# Patient Record
Sex: Female | Born: 2000 | Race: Black or African American | Hispanic: No | Marital: Single | Smoking: Never smoker
Health system: Southern US, Community
[De-identification: ages and names within clinical notes are randomized; demographics above are authoritative.]

## PROBLEM LIST (undated history)

## (undated) DIAGNOSIS — J45909 Unspecified asthma, uncomplicated: Secondary | ICD-10-CM

## (undated) DIAGNOSIS — E119 Type 2 diabetes mellitus without complications: Secondary | ICD-10-CM

---
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
## 2019-06-15 ENCOUNTER — Emergency Department
Admission: EM | Admit: 2019-06-15 | Discharge: 2019-06-15 | Disposition: A | Discharge status: Home / Self Care | Payer: Medicaid Other | Attending: Emergency Medicine | Admitting: Emergency Medicine

## 2019-06-15 ENCOUNTER — Encounter: Payer: Self-pay | Admitting: Emergency Medicine

## 2019-06-15 ENCOUNTER — Other Ambulatory Visit: Payer: Self-pay

## 2019-06-15 DIAGNOSIS — Z3A01 Less than 8 weeks gestation of pregnancy: Secondary | ICD-10-CM | POA: Insufficient documentation

## 2019-06-15 DIAGNOSIS — J45909 Unspecified asthma, uncomplicated: Secondary | ICD-10-CM | POA: Diagnosis not present

## 2019-06-15 DIAGNOSIS — O99511 Diseases of the respiratory system complicating pregnancy, first trimester: Secondary | ICD-10-CM | POA: Insufficient documentation

## 2019-06-15 DIAGNOSIS — O9989 Other specified diseases and conditions complicating pregnancy, childbirth and the puerperium: Secondary | ICD-10-CM | POA: Diagnosis present

## 2019-06-15 LAB — HCG, QUANTITATIVE, PREGNANCY: hCG, Beta Chain, Quant, S: 24393 m[IU]/mL — ABNORMAL HIGH (ref <5)

## 2019-06-15 LAB — POCT PREGNANCY, URINE: Preg Test, Ur: POSITIVE — AB

## 2019-06-15 NOTE — ED Notes (Signed)
Pt left prior to receiving d/c instructions.

## 2019-06-15 NOTE — ED Triage Notes (Signed)
Pt states she had a positive pregnancy test today and "I want to know how far along I am". Pt states she missed her period in august but did [place] on July 5th.

## 2019-06-16 NOTE — ED Provider Notes (Signed)
[facility] Emergency Department Provider Note  ____________________________________________  Time seen: Approximately 12:04 AM  I have reviewed the triage vital signs and the nursing notes.   HISTORY  Chief Complaint Possible Pregnancy    HPI Sheena Cordova is a 18 y.o. female presents to the emergency department for confirmation of pregnancy.  Patient took a pregnancy test today and it was positive.  She estimates that her last menstrual cycle was approximately 4 to 5 weeks ago.  She denies abdominal pain or pelvic pain no changes in vaginal discharge or gush of vaginal fluids.  Patient has been afebrile at home.  She states that the pregnancy is unwanted and wants to know what her options are.        Past Medical History:  Diagnosis Date  . Asthma     There are no active problems to display for this patient.   No past surgical history on file.  Prior to Admission medications   Medication Sig Start Date End Date Taking? Authorizing Provider  albuterol (PROVENTIL HFA;VENTOLIN HFA) 108 (90 BASE) MCG/ACT inhaler Inhale 2 puffs into the lungs every 6 (six) hours as needed for wheezing or shortness of breath.    [provider]  amoxicillin-clavulanate (AUGMENTIN) 875-125 MG per tablet Take 1 tablet by mouth 2 (two) times daily. 05/02/15   Triplett, Rulon Eisenmengerari B, FNP  aspirin 325 MG EC tablet Take 325 mg by mouth daily.    [provider]  doxycycline (VIBRAMYCIN) 100 MG capsule Take 1 capsule (100 mg total) by mouth 2 (two) times daily. One po bid x 7 days 09/25/15   Mesner, Barbara CowerJason, MD  HYDROcodone-acetaminophen (NORCO) 5-325 MG tablet Take 1 tablet by mouth every 12 (twelve) hours as needed for severe pain. 09/25/15   Mesner, Barbara CowerJason, MD  ibuprofen (ADVIL,MOTRIN) 400 MG tablet Take 1 tablet (400 mg total) by mouth every 6 (six) hours as needed. 05/05/15   Joni ReiningSmith, Ronald K, PA-C  ibuprofen (ADVIL,MOTRIN) 600 MG tablet Take 1 tablet (600 mg total) by  mouth once. 05/05/15   Joni ReiningSmith, Ronald K, PA-C  ibuprofen (ADVIL,MOTRIN) 800 MG tablet Take 1 tablet (800 mg total) by mouth every 8 (eight) hours as needed. 05/02/15   Triplett, Kasandra Knudsenari B, FNP  phenazopyridine (PYRIDIUM) 95 MG tablet Take 1 tablet (95 mg total) by mouth 3 (three) times daily as needed for pain. 09/25/15   Mesner, Barbara CowerJason, MD  traMADol (ULTRAM) 50 MG tablet Take 1 tablet (50 mg total) by mouth every 6 (six) hours as needed for moderate pain. 05/05/15   Joni ReiningSmith, Ronald K, PA-C  traMADol (ULTRAM) 50 MG tablet Take 1 tablet (50 mg total) by mouth 4 (four) times daily. 05/05/15   Joni ReiningSmith, Ronald K, PA-C    Allergies Patient has no known allergies.  No family history on file.  Social History Social History   Tobacco Use  . Smoking status: Never Smoker  . Smokeless tobacco: Never Used  Substance Use Topics  . Alcohol use: No  . Drug use: Not on file     Review of Systems  Constitutional: No fever/chills Eyes: No visual changes. No discharge ENT: No upper respiratory complaints. Cardiovascular: no chest pain. Respiratory: no cough. No SOB. Gastrointestinal: No abdominal pain.  No nausea, no vomiting.  No diarrhea.  No constipation. Musculoskeletal: Negative for musculoskeletal pain. Skin: Negative for rash, abrasions, lacerations, ecchymosis. Neurological: Negative for headaches, focal weakness or numbness.   ____________________________________________   PHYSICAL EXAM:  VITAL SIGNS: ED Triage Vitals [06/15/19  1958]  Enc Vitals Group     BP 120/61     Pulse Rate 90     Resp 18     Temp 98.5 F (36.9 C)     Temp Source Oral     SpO2 98 %     Weight 170 lb (77.1 kg)     Height 5\' 7"  (1.702 m)     Head Circumference      Peak Flow      Pain Score 0     Pain Loc      Pain Edu?      Excl. in GC?      Constitutional: Alert and oriented. Well appearing and in no acute distress. Eyes: Conjunctivae are normal. PERRL. EOMI. Head: Atraumatic. Cardiovascular: Normal  rate, regular rhythm. Normal S1 and S2.  Good peripheral circulation. Respiratory: Normal respiratory effort without tachypnea or retractions. Lungs CTAB. Good air entry to the bases with no decreased or absent breath sounds. Gastrointestinal: Bowel sounds 4 quadrants. Soft and nontender to palpation. No guarding or rigidity. No palpable masses. No distention. No CVA tenderness. Musculoskeletal: Full range of motion to all extremities. No gross deformities appreciated. Neurologic:  Normal speech and language. No gross focal neurologic deficits are appreciated.  Skin:  Skin is warm, dry and intact. No rash noted. Psychiatric: Mood and affect are normal. Speech and behavior are normal. Patient exhibits appropriate insight and judgement.   ____________________________________________   LABS (all labs ordered are listed, but only abnormal results are displayed)  Labs Reviewed  HCG, QUANTITATIVE, PREGNANCY - Abnormal; Notable for the following components:      Result Value   hCG, Beta Chain, Quant, S 24,393 (*)    All other components within normal limits  POCT PREGNANCY, URINE - Abnormal; Notable for the following components:   Preg Test, Ur POSITIVE (*)    All other components within normal limits  POC URINE PREG, ED   ____________________________________________  EKG   ____________________________________________  RADIOLOGY   No results found.  ____________________________________________    PROCEDURES  Procedure(s) performed:    Procedures    Medications - No data to display   ____________________________________________   INITIAL IMPRESSION / ASSESSMENT AND PLAN / ED COURSE  Pertinent labs & imaging results that were available during my care of the patient were reviewed by me and considered in my medical decision making (see chart for details).  Review of the [place] CSRS was performed in accordance of the [facility] prior to dispensing any controlled drugs.            Assessment and plan Positive pregnancy 18 year old female presents to the emergency department with concern for pregnancy after taking a pregnancy test earlier in the day.  Vital signs are reassuring at triage.  Patient denied abdominal pain or pelvic pain during this emergency department encounter.  Urine pregnancy testing was positive in the emergency department and beta-hCG was elevated at 24,393.  [facility] [place] [facility] were given per patient request.  Advised patient to follow-up with OB/GYN.  Also advised patient to start taking prenatal vitamins until she makes a decision about outcome of pregnancy.  She voiced understanding.  All patient questions were answered.    ____________________________________________  FINAL CLINICAL IMPRESSION(S) / ED DIAGNOSES  Final diagnoses:  Less than [redacted] weeks gestation of pregnancy      NEW MEDICATIONS STARTED DURING THIS VISIT:  ED Discharge Orders    None  This chart was dictated using voice recognition software/Dragon. Despite best efforts to proofread, errors can occur which can change the meaning. Any change was purely unintentional.    Lannie Fields, PA-C 06/16/19 0007    Harvest Dark, MD 06/18/19 [phone]

## 2019-10-20 ENCOUNTER — Emergency Department: Payer: Medicaid Other

## 2019-10-20 ENCOUNTER — Encounter: Payer: Self-pay | Admitting: Emergency Medicine

## 2019-10-20 ENCOUNTER — Other Ambulatory Visit: Payer: Self-pay

## 2019-10-20 ENCOUNTER — Emergency Department
Admission: EM | Admit: 2019-10-20 | Discharge: 2019-10-20 | Disposition: A | Discharge status: Home / Self Care | Payer: Medicaid Other | Attending: Emergency Medicine | Admitting: Emergency Medicine

## 2019-10-20 DIAGNOSIS — R109 Unspecified abdominal pain: Secondary | ICD-10-CM

## 2019-10-20 DIAGNOSIS — Z3491 Encounter for supervision of normal pregnancy, unspecified, first trimester: Secondary | ICD-10-CM | POA: Insufficient documentation

## 2019-10-20 DIAGNOSIS — N3090 Cystitis, unspecified without hematuria: Secondary | ICD-10-CM | POA: Insufficient documentation

## 2019-10-20 DIAGNOSIS — R1030 Lower abdominal pain, unspecified: Secondary | ICD-10-CM | POA: Diagnosis present

## 2019-10-20 LAB — CBC WITH DIFFERENTIAL/PLATELET
Abs Immature Granulocytes: 0.03 10*3/uL (ref 0.00–0.07)
Basophils Absolute: 0 10*3/uL (ref 0.0–0.1)
Basophils Relative: 0 %
Eosinophils Absolute: 0.1 10*3/uL (ref 0.0–0.5)
Eosinophils Relative: 1 %
HCT: 34.7 % — ABNORMAL LOW (ref 36.0–46.0)
Hemoglobin: 11.9 g/dL — ABNORMAL LOW (ref 12.0–15.0)
Immature Granulocytes: 0 %
Lymphocytes Relative: 28 %
Lymphs Abs: 2.2 10*3/uL (ref 0.7–4.0)
MCH: 27.1 pg (ref 26.0–34.0)
MCHC: 34.3 g/dL (ref 30.0–36.0)
MCV: 79 fL — ABNORMAL LOW (ref 80.0–100.0)
Monocytes Absolute: 0.8 10*3/uL (ref 0.1–1.0)
Monocytes Relative: 11 %
Neutro Abs: 4.7 10*3/uL (ref 1.7–7.7)
Neutrophils Relative %: 60 %
Platelets: 266 10*3/uL (ref 150–400)
RBC: 4.39 MIL/uL (ref 3.87–5.11)
RDW: 14.6 % (ref 11.5–15.5)
WBC: 7.8 10*3/uL (ref 4.0–10.5)
nRBC: 0 % (ref 0.0–0.2)

## 2019-10-20 LAB — URINALYSIS, COMPLETE (UACMP) WITH MICROSCOPIC
Bilirubin Urine: NEGATIVE
Glucose, UA: NEGATIVE mg/dL
Hgb urine dipstick: NEGATIVE
Ketones, ur: NEGATIVE mg/dL
Nitrite: POSITIVE — AB
Protein, ur: 30 mg/dL — AB
Specific Gravity, Urine: 1.025 (ref 1.005–1.030)
pH: 6 (ref 5.0–8.0)

## 2019-10-20 LAB — BASIC METABOLIC PANEL
Anion gap: 8 (ref 5–15)
BUN: 15 mg/dL (ref 6–20)
CO2: 24 mmol/L (ref 22–32)
Calcium: 9.6 mg/dL (ref 8.9–10.3)
Chloride: 105 mmol/L (ref 98–111)
Creatinine, Ser: 0.77 mg/dL (ref 0.44–1.00)
GFR calc Af Amer: >60 mL/min (ref >60)
GFR calc non Af Amer: >60 mL/min (ref >60)
Glucose, Bld: 87 mg/dL (ref 70–99)
Potassium: 3.9 mmol/L (ref 3.5–5.1)
Sodium: 137 mmol/L (ref 135–145)

## 2019-10-20 LAB — HCG, QUANTITATIVE, PREGNANCY: hCG, Beta Chain, Quant, S: 843 m[IU]/mL — ABNORMAL HIGH (ref <5)

## 2019-10-20 LAB — POCT PREGNANCY, URINE: Preg Test, Ur: POSITIVE — AB

## 2019-10-20 MED ORDER — NITROFURANTOIN MACROCRYSTAL 100 MG PO CAPS: 100.0000 mg | ORAL_CAPSULE | Freq: Two times a day (BID) | ORAL | 0 refills | Status: AC

## 2019-10-20 MED ORDER — PRENATAL MULTIVITAMIN CH: 1.0000 | ORAL_TABLET | Freq: Every day | ORAL | 3 refills | Status: AC

## 2019-10-20 NOTE — ED Provider Notes (Signed)
[facility] Emergency Department Provider Note  ____________________________________________  Time seen: Approximately 8:57 PM  I have reviewed the triage vital signs and the nursing notes.   HISTORY  Chief Complaint Abdominal Pain    HPI Sheena Cordova is a 18 y.o. female with a history of asthma who comes the ED complaining of cramping low abdominal pain for the past 3 days, intermittent, no aggravating or alleviating factors, nonradiating.  No vaginal bleeding or discharge.  Denies dysuria frequency urgency.  Took a pregnancy test at home which was faintly positive.  Denies chest pain shortness of breath fevers chills.   Last period was September 20, 2019.   Past Medical History:  Diagnosis Date  . Asthma      There are no problems to display for this patient.    History reviewed. No pertinent surgical history.   Prior to Admission medications   Medication Sig Start Date End Date Taking? Authorizing Provider  albuterol (PROVENTIL HFA;VENTOLIN HFA) 108 (90 BASE) MCG/ACT inhaler Inhale 2 puffs into the lungs every 6 (six) hours as needed for wheezing or shortness of breath.    [provider]  amoxicillin-clavulanate (AUGMENTIN) 875-125 MG per tablet Take 1 tablet by mouth 2 (two) times daily. 05/02/15   Triplett, Johnette Abraham B, FNP  aspirin 325 MG EC tablet Take 325 mg by mouth daily.    [provider]  doxycycline (VIBRAMYCIN) 100 MG capsule Take 1 capsule (100 mg total) by mouth 2 (two) times daily. One po bid x 7 days 09/25/15   Mesner, Corene Cornea, MD  HYDROcodone-acetaminophen (NORCO) 5-325 MG tablet Take 1 tablet by mouth every 12 (twelve) hours as needed for severe pain. 09/25/15   Mesner, Corene Cornea, MD  ibuprofen (ADVIL,MOTRIN) 400 MG tablet Take 1 tablet (400 mg total) by mouth every 6 (six) hours as needed. 05/05/15   Sable Feil, PA-C  ibuprofen (ADVIL,MOTRIN) 600 MG tablet Take 1 tablet (600 mg total) by mouth once. 05/05/15   Sable Feil, PA-C  ibuprofen (ADVIL,MOTRIN) 800 MG tablet Take 1 tablet (800 mg total) by mouth every 8 (eight) hours as needed. 05/02/15   Triplett, Johnette Abraham B, FNP  nitrofurantoin (MACRODANTIN) 100 MG capsule Take 1 capsule (100 mg total) by mouth 2 (two) times daily for 7 days. 10/20/19 10/27/19  Carrie Mew, MD  phenazopyridine (PYRIDIUM) 95 MG tablet Take 1 tablet (95 mg total) by mouth 3 (three) times daily as needed for pain. 09/25/15   Mesner, Corene Cornea, MD  Prenatal Vit-Fe Fumarate-FA (PRENATAL MULTIVITAMIN) TABS tablet Take 1 tablet by mouth daily at 12 noon. 10/20/19   Carrie Mew, MD  traMADol (ULTRAM) 50 MG tablet Take 1 tablet (50 mg total) by mouth every 6 (six) hours as needed for moderate pain. 05/05/15   Sable Feil, PA-C  traMADol (ULTRAM) 50 MG tablet Take 1 tablet (50 mg total) by mouth 4 (four) times daily. 05/05/15   Sable Feil, PA-C     Allergies Patient has no known allergies.   No family history on file.  Social History Social History   Tobacco Use  . Smoking status: Never Smoker  . Smokeless tobacco: Never Used  Substance Use Topics  . Alcohol use: No  . Drug use: Not on file    Review of Systems  Constitutional:   No fever or chills.  ENT:   No sore throat. No rhinorrhea. Cardiovascular:   No chest pain or syncope. Respiratory:   No dyspnea or cough. Gastrointestinal:  Positive as above for low abdominal pain without vomiting and diarrhea.  Musculoskeletal:   Negative for focal pain or swelling All other systems reviewed and are negative except as documented above in ROS and HPI.  ____________________________________________   PHYSICAL EXAM:  VITAL SIGNS: ED Triage Vitals  Enc Vitals Group     BP 10/20/19 1839 123/76     Pulse Rate 10/20/19 1839 89     Resp 10/20/19 1839 16     Temp 10/20/19 1839 98.5 F (36.9 C)     Temp Source 10/20/19 1839 Oral     SpO2 10/20/19 1839 100 %     Weight 10/20/19 1840 169 lb 15.6 oz (77.1 kg)      Height 10/20/19 1840 5\' 7"  (1.702 m)     Head Circumference --      Peak Flow --      Pain Score 10/20/19 1839 0     Pain Loc --      Pain Edu? --      Excl. in GC? --     Vital signs reviewed, nursing assessments reviewed.   Constitutional:   Alert and oriented. Non-toxic appearance. Eyes:   Conjunctivae are normal. EOMI.  ENT      Head:   Normocephalic and atraumatic.      Nose:   Wearing a mask.      Mouth/Throat:   Wearing a mask.      Neck:   No meningismus. Full ROM.  Cardiovascular:   RRR. Respiratory:   Normal respiratory effort without tachypnea/retractions.  Gastrointestinal:   Soft and nontender. Non distended. There is no CVA tenderness.  No rebound, rigidity, or guarding. Musculoskeletal:   Normal range of motion in all extremities. No joint effusions.  No lower extremity tenderness.  No edema. Neurologic:   Normal speech and language.  Motor grossly intact. No acute focal neurologic deficits are appreciated.  Skin:    Skin is warm, dry and intact. No rash noted.  No petechiae, purpura, or bullae.  ____________________________________________    LABS (pertinent positives/negatives) (all labs ordered are listed, but only abnormal results are displayed) Labs Reviewed  URINALYSIS, COMPLETE (UACMP) WITH MICROSCOPIC - Abnormal; Notable for the following components:      Result Value   Color, Urine YELLOW (*)    APPearance CLOUDY (*)    Protein, ur 30 (*)    Nitrite POSITIVE (*)    Leukocytes,Ua LARGE (*)    Bacteria, UA MANY (*)    All other components within normal limits  CBC WITH DIFFERENTIAL/PLATELET - Abnormal; Notable for the following components:   Hemoglobin 11.9 (*)    HCT 34.7 (*)    MCV 79.0 (*)    All other components within normal limits  HCG, QUANTITATIVE, PREGNANCY - Abnormal; Notable for the following components:   hCG, Beta Chain, Quant, S 843 (*)    All other components within normal limits  POCT PREGNANCY, URINE - Abnormal; Notable for  the following components:   Preg Test, Ur POSITIVE (*)    All other components within normal limits  URINE CULTURE  BASIC METABOLIC PANEL  POC URINE PREG, ED   ____________________________________________   EKG    ____________________________________________    RADIOLOGY  10/22/19 OB LESS THAN 14 WEEKS WITH OB TRANSVAGINAL  Result Date: 10/20/2019 CLINICAL DATA:  Abdominal pain for several days. Estimated gestational age by last menstrual period equals 4 weeks 2 days. EXAM: OBSTETRIC <14 WK 10/22/2019 AND TRANSVAGINAL OB US TECHNIQUE: Both transabdominal  and transvaginal ultrasound examinations were performed for complete evaluation of the gestation as well as the maternal uterus, adnexal regions, and pelvic cul-de-sac. Transvaginal technique was performed to assess early pregnancy. COMPARISON:  None. FINDINGS: Intrauterine gestational sac: Irregular sac-like structure in the endometrial canal. Yolk sac identified. Yolk sac:  Not identified Embryo:  Not identified MSD: 5.0 mm   5 w   2 d Subchorionic hemorrhage:  None visualized. Maternal uterus/adnexae: Normal ovaries. Corpus luteal cyst of the LEFT ovary. Trace free fluid. IMPRESSION: Probable early intrauterine gestational sac, but no yolk sac, fetal pole, or cardiac activity yet visualized. Recommend follow-up quantitative B-HCG levels and follow-up US in 14 days to assess viability. This recommendation follows SRU consensus guidelines: Diagnostic Criteria for Nonviable Pregnancy Early in the First Trimester. Malva Limes Engl J Med 2013; 161:0960-45; 369:1443-51. Electronically Signed   By: Genevive BiStewart  Edmunds M.D.   On: 10/20/2019 20:42    ____________________________________________   PROCEDURES Procedures  ____________________________________________    CLINICAL IMPRESSION / ASSESSMENT AND PLAN / ED COURSE  Medications ordered in the ED: Medications - No data to display  Pertinent labs & imaging results that were available during my care of the patient were  reviewed by me and considered in my medical decision making (see chart for details).  Sheena Cordova was evaluated in Emergency Department on 10/20/2019 for the symptoms described in the history of present illness. She was evaluated in the context of the global COVID-19 pandemic, which necessitated consideration that the patient might be at risk for infection with the SARS-CoV-2 virus that causes COVID-19. Institutional protocols and algorithms that pertain to the evaluation of patients at risk for COVID-19 are in a state of rapid change based on information released by regulatory bodies including the CDC and federal and state organizations. These policies and algorithms were followed during the patient's care in the ED.   Patient presents with abdominal cramping pain.  Pregnancy test positive, hCG of 800.  Pelvic ultrasound was obtained which shows a gestational sac in the uterus, not definitive for IUP.  Patient's not having any bleeding, vital signs are normal, does not require any additional work-up at this time.  Recommended starting prenatal vitamins, [facility] in the next 1 to 2 weeks.  Cautioned that if she develops severe pain or vaginal bleeding she should return for reassessment immediately.  Otherwise, take prenatal vitamins, take Macrobid for urinary tract infection as shown by UTI.  Urine culture ordered.      ____________________________________________   FINAL CLINICAL IMPRESSION(S) / ED DIAGNOSES    Final diagnoses:  First trimester pregnancy  Cystitis     ED Discharge Orders         Ordered    nitrofurantoin (MACRODANTIN) 100 MG capsule  2 times daily     10/20/19 2056    Prenatal Vit-Fe Fumarate-FA (PRENATAL MULTIVITAMIN) TABS tablet  Daily     10/20/19 2056          Portions of this note were generated with dragon dictation software. Dictation errors may occur despite best attempts at proofreading.   Sharman CheekStafford, Jameis Newsham, MD 10/20/19  2059

## 2019-10-20 NOTE — ED Triage Notes (Signed)
Abdominal cramping x 3 days.  Denies vaginal bleeding.  Two home pregnancy tests taken, both positive.  G1P1

## 2019-10-23 LAB — URINE CULTURE: Culture: >=100000 — AB

## 2019-10-24 NOTE — Progress Notes (Signed)
Brief Pharmacy Note  Patient is a 18 y/o female who presented to [facility] ED 12/14 with chief complaint of abdominal pain. She denied dysuria, frequency, urgency. No fevers or chills. Reported pregnancy test at home was positive. Pregnancy test in ED also positive. UA with many bacteria, 11-20 squams, 21-50 WBC, 6-10 RBC. She was discharged on nitrofurantoin. Urine culture has resulted >100k colonies/mL Enterobacter aerogenes (nitrofurantoin intermediate susceptibility). Spoke with EDP regarding result with plan to call and assess patient and advise her to have urine re-screened at pre-natal care appointment or prescribe antibiotic if symptomatic.   Reached patient via telephone. She said she is not having any urinary symptoms, no fevers/chills, or back pain. She has not picked up discharge antibiotic. Informed her that discharge antibiotic would likely be ineffective versus identified bacteria. Since many squamous cells were found on UA difficult to interpret if bacteria in urine or contamination. Advised patient to be re-screened at pre-natal care appointment which she is supposed to schedule within 2 weeks. Will defer pursuing new antibiotic at this time.   [place] Resident 24 October 2019

## 2020-02-10 ENCOUNTER — Emergency Department
Admission: EM | Admit: 2020-02-10 | Discharge: 2020-02-10 | Disposition: A | Discharge status: Home / Self Care | Payer: Medicaid Other | Attending: Emergency Medicine | Admitting: Emergency Medicine

## 2020-02-10 ENCOUNTER — Telehealth: Payer: Self-pay | Admitting: Emergency Medicine

## 2020-02-10 ENCOUNTER — Other Ambulatory Visit: Payer: Self-pay

## 2020-02-10 DIAGNOSIS — Z5321 Procedure and treatment not carried out due to patient leaving prior to being seen by health care provider: Secondary | ICD-10-CM | POA: Diagnosis not present

## 2020-02-10 DIAGNOSIS — R0789 Other chest pain: Secondary | ICD-10-CM | POA: Insufficient documentation

## 2020-02-10 LAB — COMPREHENSIVE METABOLIC PANEL
ALT: 8 U/L (ref 0–44)
AST: 12 U/L — ABNORMAL LOW (ref 15–41)
Albumin: 3.4 g/dL — ABNORMAL LOW (ref 3.5–5.0)
Alkaline Phosphatase: 58 U/L (ref 38–126)
Anion gap: 8 (ref 5–15)
BUN: 8 mg/dL (ref 6–20)
CO2: 25 mmol/L (ref 22–32)
Calcium: 9 mg/dL (ref 8.9–10.3)
Chloride: 103 mmol/L (ref 98–111)
Creatinine, Ser: 0.52 mg/dL (ref 0.44–1.00)
GFR calc Af Amer: >60 mL/min (ref >60)
GFR calc non Af Amer: >60 mL/min (ref >60)
Glucose, Bld: 101 mg/dL — ABNORMAL HIGH (ref 70–99)
Potassium: 3.2 mmol/L — ABNORMAL LOW (ref 3.5–5.1)
Sodium: 136 mmol/L (ref 135–145)
Total Bilirubin: 1.2 mg/dL (ref 0.3–1.2)
Total Protein: 7 g/dL (ref 6.5–8.1)

## 2020-02-10 LAB — CBC WITH DIFFERENTIAL/PLATELET
Abs Immature Granulocytes: 0.06 10*3/uL (ref 0.00–0.07)
Basophils Absolute: 0 10*3/uL (ref 0.0–0.1)
Basophils Relative: 0 %
Eosinophils Absolute: 0.2 10*3/uL (ref 0.0–0.5)
Eosinophils Relative: 1 %
HCT: 29.7 % — ABNORMAL LOW (ref 36.0–46.0)
Hemoglobin: 9.7 g/dL — ABNORMAL LOW (ref 12.0–15.0)
Immature Granulocytes: 1 %
Lymphocytes Relative: 23 %
Lymphs Abs: 2.8 10*3/uL (ref 0.7–4.0)
MCH: 28.1 pg (ref 26.0–34.0)
MCHC: 32.7 g/dL (ref 30.0–36.0)
MCV: 86.1 fL (ref 80.0–100.0)
Monocytes Absolute: 1 10*3/uL (ref 0.1–1.0)
Monocytes Relative: 8 %
Neutro Abs: 8.3 10*3/uL — ABNORMAL HIGH (ref 1.7–7.7)
Neutrophils Relative %: 67 %
Platelets: 227 10*3/uL (ref 150–400)
RBC: 3.45 MIL/uL — ABNORMAL LOW (ref 3.87–5.11)
RDW: 13.4 % (ref 11.5–15.5)
WBC: 12.4 10*3/uL — ABNORMAL HIGH (ref 4.0–10.5)
nRBC: 0 % (ref 0.0–0.2)

## 2020-02-10 LAB — TROPONIN I (HIGH SENSITIVITY): Troponin I (High Sensitivity): <2 ng/L (ref <18)

## 2020-02-10 NOTE — ED Triage Notes (Signed)
Pt to the er for pain to the chest midsternal and under both breasts. No c/o vaginal bleeding or cramping. Pt reports the pain started 30 minutes ago. Pt appears SOB.

## 2020-02-10 NOTE — ED Notes (Signed)
No answer when called from lobby x3. 

## 2020-02-10 NOTE — ED Notes (Signed)
No answer when called from lobby x1 

## 2020-02-10 NOTE — ED Notes (Signed)
No answer when called from lobby x2. 

## 2020-02-10 NOTE — ED Notes (Signed)
Pt announced she is walking outside.

## 2020-02-17 ENCOUNTER — Encounter: Payer: Self-pay | Admitting: Emergency Medicine

## 2020-02-17 ENCOUNTER — Other Ambulatory Visit: Payer: Self-pay

## 2020-02-17 DIAGNOSIS — O209 Hemorrhage in early pregnancy, unspecified: Secondary | ICD-10-CM | POA: Insufficient documentation

## 2020-02-17 DIAGNOSIS — Z3A21 21 weeks gestation of pregnancy: Secondary | ICD-10-CM | POA: Diagnosis not present

## 2020-02-17 DIAGNOSIS — K920 Hematemesis: Secondary | ICD-10-CM | POA: Diagnosis not present

## 2020-02-17 DIAGNOSIS — O99891 Other specified diseases and conditions complicating pregnancy: Secondary | ICD-10-CM | POA: Insufficient documentation

## 2020-02-17 DIAGNOSIS — Z5321 Procedure and treatment not carried out due to patient leaving prior to being seen by health care provider: Secondary | ICD-10-CM | POA: Diagnosis not present

## 2020-02-17 LAB — CBC
HCT: 29.9 % — ABNORMAL LOW (ref 36.0–46.0)
Hemoglobin: 9.9 g/dL — ABNORMAL LOW (ref 12.0–15.0)
MCH: 28 pg (ref 26.0–34.0)
MCHC: 33.1 g/dL (ref 30.0–36.0)
MCV: 84.7 fL (ref 80.0–100.0)
Platelets: 245 10*3/uL (ref 150–400)
RBC: 3.53 MIL/uL — ABNORMAL LOW (ref 3.87–5.11)
RDW: 13.2 % (ref 11.5–15.5)
WBC: 12.2 10*3/uL — ABNORMAL HIGH (ref 4.0–10.5)
nRBC: 0 % (ref 0.0–0.2)

## 2020-02-17 LAB — COMPREHENSIVE METABOLIC PANEL
ALT: 9 U/L (ref 0–44)
AST: 14 U/L — ABNORMAL LOW (ref 15–41)
Albumin: 3.4 g/dL — ABNORMAL LOW (ref 3.5–5.0)
Alkaline Phosphatase: 64 U/L (ref 38–126)
Anion gap: 9 (ref 5–15)
BUN: 7 mg/dL (ref 6–20)
CO2: 24 mmol/L (ref 22–32)
Calcium: 8.9 mg/dL (ref 8.9–10.3)
Chloride: 103 mmol/L (ref 98–111)
Creatinine, Ser: 0.43 mg/dL — ABNORMAL LOW (ref 0.44–1.00)
GFR calc Af Amer: >60 mL/min (ref >60)
GFR calc non Af Amer: >60 mL/min (ref >60)
Glucose, Bld: 88 mg/dL (ref 70–99)
Potassium: 3.8 mmol/L (ref 3.5–5.1)
Sodium: 136 mmol/L (ref 135–145)
Total Bilirubin: 1.1 mg/dL (ref 0.3–1.2)
Total Protein: 7.4 g/dL (ref 6.5–8.1)

## 2020-02-17 NOTE — ED Triage Notes (Signed)
Pt to ED from home c/o hematemesis x4-5 days and vaginal bleeding with blood clots for two days that are small.  Pt states [redacted] weeks pregnant without complications, second pregnancy.  Denies pain or SOB.

## 2020-02-18 ENCOUNTER — Emergency Department
Admission: EM | Admit: 2020-02-18 | Discharge: 2020-02-18 | Disposition: A | Discharge status: Home / Self Care | Payer: Medicaid Other | Attending: Emergency Medicine | Admitting: Emergency Medicine

## 2020-02-18 LAB — HCG, QUANTITATIVE, PREGNANCY: hCG, Beta Chain, Quant, S: 40853 m[IU]/mL — ABNORMAL HIGH (ref <5)

## 2020-02-18 NOTE — ED Notes (Signed)
No answer when called several times from lobby 

## 2020-02-19 ENCOUNTER — Telehealth: Payer: Self-pay | Admitting: Emergency Medicine

## 2020-02-19 NOTE — Telephone Encounter (Signed)
Called patient due to lwot to inquire about condition and follow up plans. She says she has appt next week with ob.  I asked her to call them and let them know labs are available in epic.

## 2020-02-20 ENCOUNTER — Other Ambulatory Visit: Payer: Self-pay

## 2020-02-20 ENCOUNTER — Observation Stay
Admission: EM | Admit: 2020-02-20 | Discharge: 2020-02-20 | Disposition: A | Discharge status: Home / Self Care | Payer: Medicaid Other | Attending: Obstetrics and Gynecology | Admitting: Obstetrics and Gynecology

## 2020-02-20 DIAGNOSIS — O468X2 Other antepartum hemorrhage, second trimester: Principal | ICD-10-CM | POA: Insufficient documentation

## 2020-02-20 DIAGNOSIS — Z3A21 21 weeks gestation of pregnancy: Secondary | ICD-10-CM | POA: Diagnosis not present

## 2020-02-20 DIAGNOSIS — Z3A22 22 weeks gestation of pregnancy: Secondary | ICD-10-CM

## 2020-02-20 DIAGNOSIS — R198 Other specified symptoms and signs involving the digestive system and abdomen: Secondary | ICD-10-CM | POA: Diagnosis present

## 2020-02-20 DIAGNOSIS — N939 Abnormal uterine and vaginal bleeding, unspecified: Secondary | ICD-10-CM

## 2020-02-20 DIAGNOSIS — K920 Hematemesis: Secondary | ICD-10-CM

## 2020-02-20 NOTE — OB Triage Note (Addendum)
Pt is a 19 yo G2P0. Pt was transported by EMS to [facility]. Pt reports that 2 days ago upon using the bathroom she noticed a number of blood clots. No reports of blood in the last 24 hours. Pt reported to the [facility] ER twice this week however pt left before being seen. Pt reports having 2 large amounts of N/V tonight. Pt reports cramping in the epigastric area causing some shortness of breath at night upon laying down. Pt reports having intercourse with in the last 24 hours.

## 2020-02-23 NOTE — Final Progress Note (Signed)
L&D OB Triage Note  Sheena Cordova is a 19 y.o. G2P0 female at [redacted]w[redacted]d, EDD Estimated Date of Delivery: 06/26/20 who presented to triage from the Emergency Room for complaints of hematemesis for 4-5 days and vaginal bleeding x 2 days (passed small clots), however no OB issues today.  She denied vaginal bleeding or cramping. She was evaluated by the nurses with no significant findings. Vital signs stable. An NST was performed and has been reviewed by MD.    NST INTERPRETATION: Indications: vaginal bleeding and patient reassurance  Mode: External Baseline Rate (A): 135 bpm Variability: Moderate Accelerations: None       Contraction Frequency (min): None   Impression: reactive   Plan: NST performed was reviewed and was found to be reactive. She was discharged home with bleeding/preterm labor precautions.  Continue routine prenatal care. Follow up with OB/GYN as previously scheduled.     Hildred Laser, MD  [facility]

## 2020-07-17 ENCOUNTER — Emergency Department
Admission: EM | Admit: 2020-07-17 | Discharge: 2020-07-17 | Discharge status: Against Medical Advice | Payer: Medicaid Other

## 2021-03-02 ENCOUNTER — Telehealth: Payer: Self-pay | Admitting: Family Medicine

## 2021-03-02 NOTE — Telephone Encounter (Signed)
Patient called to get information on abortions. Let her know that we don't do that but that I could take her name and number and have a provider call her back to speak with her. Patient agreed.

## 2021-03-02 NOTE — Telephone Encounter (Signed)
Phone call to pt. Confirmed for pt that [facility] does not provide pregnancy termination services at our facility. Pt states she already has the number to contact another provider for services and does not need our resource information at this time.

## 2021-05-30 ENCOUNTER — Observation Stay
Admission: EM | Admit: 2021-05-30 | Discharge: 2021-05-30 | Disposition: A | Discharge status: Home / Self Care | Payer: Medicaid Other | Attending: Obstetrics and Gynecology | Admitting: Obstetrics and Gynecology

## 2021-05-30 ENCOUNTER — Other Ambulatory Visit: Payer: Self-pay

## 2021-05-30 ENCOUNTER — Encounter: Payer: Self-pay | Admitting: Obstetrics and Gynecology

## 2021-05-30 DIAGNOSIS — Z3A37 37 weeks gestation of pregnancy: Secondary | ICD-10-CM

## 2021-05-30 DIAGNOSIS — O26893 Other specified pregnancy related conditions, third trimester: Secondary | ICD-10-CM | POA: Diagnosis not present

## 2021-05-30 DIAGNOSIS — R102 Pelvic and perineal pain: Secondary | ICD-10-CM | POA: Diagnosis not present

## 2021-05-30 DIAGNOSIS — R1084 Generalized abdominal pain: Secondary | ICD-10-CM | POA: Diagnosis not present

## 2021-05-30 NOTE — OB Triage Note (Signed)
Pt G3P2 at [redacted]w[redacted]d reports with c/o pelvic pain since Friday. Pt goes to [facility] for [facility] and was seen at [facility] on Friday. Pt states they prescribed her tylenol and it has not helped. Pt reports +FM. Denies LOF/bleeding. VSS. Monitors applied. Pt reports no complications with this pregnancy.

## 2021-06-01 NOTE — Discharge Summary (Signed)
    L&D OB Triage Note  SUBJECTIVE Sheena Cordova is a 20 y.o. [id number] female at [redacted]w[redacted]d, EDD Estimated Date of Delivery: 06/17/21 who presented to triage with c/o pelvic pain since Friday.  Pt reports +FM. Denies LOF/bleeding.  Reports no complications with this pregnancy.   OB History  Gravida Para Term Preterm AB Living  4 2 2  0 0 2  SAB IAB Ectopic Multiple Live Births  0 0 0 0 0    # Outcome Date GA Lbr Len/2nd Weight Sex Delivery Anes PTL Lv  4 Current           3 Term           2 Gravida           1 Term             No medications prior to admission.     OBJECTIVE  Nursing Evaluation:   BP 109/65 (BP Location: Left Arm)   Pulse 88   Temp 98 F (36.7 C) (Oral)   Resp 16    Findings:        Rare contractions     Patient not in labor.      NST was performed and has been reviewed by me.  NST INTERPRETATION: Category I  Mode: External Baseline Rate (A): 135 bpm Variability: Moderate Accelerations: 15 x 15 Decelerations: None     Contraction Frequency (min): none noteed  ASSESSMENT Impression:  1.  Pregnancy:  at [redacted]w[redacted]d , EDD Estimated Date of Delivery: 06/17/21 2.  Reassuring fetal and maternal status   PLAN 1. Discussed current condition and above findings with patient and reassurance given.  All questions answered. 2. Discharge home with standard labor precautions given to return to L&D or call the office for problems. 3. Continue routine prenatal care at [facility]

## 2022-04-10 ENCOUNTER — Other Ambulatory Visit: Payer: Self-pay

## 2022-04-10 ENCOUNTER — Emergency Department: Payer: Medicaid Other

## 2022-04-10 ENCOUNTER — Encounter: Payer: Self-pay | Admitting: Radiology

## 2022-04-10 ENCOUNTER — Emergency Department
Admission: EM | Admit: 2022-04-10 | Discharge: 2022-04-10 | Disposition: A | Discharge status: Home / Self Care | Payer: Medicaid Other | Attending: Emergency Medicine | Admitting: Emergency Medicine

## 2022-04-10 DIAGNOSIS — R000 Tachycardia, unspecified: Secondary | ICD-10-CM | POA: Insufficient documentation

## 2022-04-10 DIAGNOSIS — R0789 Other chest pain: Secondary | ICD-10-CM | POA: Diagnosis not present

## 2022-04-10 DIAGNOSIS — Z7982 Long term (current) use of aspirin: Secondary | ICD-10-CM | POA: Insufficient documentation

## 2022-04-10 DIAGNOSIS — D72829 Elevated white blood cell count, unspecified: Secondary | ICD-10-CM | POA: Insufficient documentation

## 2022-04-10 DIAGNOSIS — R079 Chest pain, unspecified: Secondary | ICD-10-CM

## 2022-04-10 DIAGNOSIS — R10816 Epigastric abdominal tenderness: Secondary | ICD-10-CM | POA: Insufficient documentation

## 2022-04-10 DIAGNOSIS — J45909 Unspecified asthma, uncomplicated: Secondary | ICD-10-CM | POA: Insufficient documentation

## 2022-04-10 LAB — CBC WITH DIFFERENTIAL/PLATELET
Abs Immature Granulocytes: 0.06 10*3/uL (ref 0.00–0.07)
Basophils Absolute: 0 10*3/uL (ref 0.0–0.1)
Basophils Relative: 0 %
Eosinophils Absolute: 0.2 10*3/uL (ref 0.0–0.5)
Eosinophils Relative: 1 %
HCT: 27.4 % — ABNORMAL LOW (ref 36.0–46.0)
Hemoglobin: 8 g/dL — ABNORMAL LOW (ref 12.0–15.0)
Immature Granulocytes: 0 %
Lymphocytes Relative: 21 %
Lymphs Abs: 3.1 10*3/uL (ref 0.7–4.0)
MCH: 22.2 pg — ABNORMAL LOW (ref 26.0–34.0)
MCHC: 29.2 g/dL — ABNORMAL LOW (ref 30.0–36.0)
MCV: 76.1 fL — ABNORMAL LOW (ref 80.0–100.0)
Monocytes Absolute: 1.1 10*3/uL — ABNORMAL HIGH (ref 0.1–1.0)
Monocytes Relative: 7 %
Neutro Abs: 10.3 10*3/uL — ABNORMAL HIGH (ref 1.7–7.7)
Neutrophils Relative %: 71 %
Platelets: 242 10*3/uL (ref 150–400)
RBC: 3.6 MIL/uL — ABNORMAL LOW (ref 3.87–5.11)
RDW: 15.9 % — ABNORMAL HIGH (ref 11.5–15.5)
WBC: 14.8 10*3/uL — ABNORMAL HIGH (ref 4.0–10.5)
nRBC: 0 % (ref 0.0–0.2)

## 2022-04-10 LAB — COMPREHENSIVE METABOLIC PANEL
ALT: 13 U/L (ref 0–44)
AST: 27 U/L (ref 15–41)
Albumin: 3.9 g/dL (ref 3.5–5.0)
Alkaline Phosphatase: 74 U/L (ref 38–126)
Anion gap: 8 (ref 5–15)
BUN: 20 mg/dL (ref 6–20)
CO2: 26 mmol/L (ref 22–32)
Calcium: 9 mg/dL (ref 8.9–10.3)
Chloride: 107 mmol/L (ref 98–111)
Creatinine, Ser: 0.91 mg/dL (ref 0.44–1.00)
GFR, Estimated: >60 mL/min (ref >60)
Glucose, Bld: 128 mg/dL — ABNORMAL HIGH (ref 70–99)
Potassium: 3.4 mmol/L — ABNORMAL LOW (ref 3.5–5.1)
Sodium: 141 mmol/L (ref 135–145)
Total Bilirubin: 1.6 mg/dL — ABNORMAL HIGH (ref 0.3–1.2)
Total Protein: 7.8 g/dL (ref 6.5–8.1)

## 2022-04-10 LAB — LIPASE, BLOOD: Lipase: 50 U/L (ref 11–51)

## 2022-04-10 LAB — D-DIMER, QUANTITATIVE: D-Dimer, Quant: <0.27 ug/mL-FEU (ref 0.00–0.50)

## 2022-04-10 LAB — POC URINE PREG, ED: Preg Test, Ur: NEGATIVE

## 2022-04-10 LAB — TROPONIN I (HIGH SENSITIVITY)
Troponin I (High Sensitivity): <2 ng/L (ref <18)
Troponin I (High Sensitivity): <2 ng/L (ref <18)

## 2022-04-10 MED ORDER — FAMOTIDINE 20 MG PO TABS: 20.0000 mg | ORAL_TABLET | Freq: Two times a day (BID) | ORAL | 0 refills | Status: AC

## 2022-04-10 MED ORDER — FENTANYL CITRATE PF 50 MCG/ML IJ SOSY
50.0000 ug | PREFILLED_SYRINGE | Freq: Once | INTRAMUSCULAR | Status: AC
  Administered 2022-04-10: 50 ug via INTRAVENOUS
  Filled 2022-04-10: qty 1

## 2022-04-10 MED ORDER — ONDANSETRON HCL 4 MG/2ML IJ SOLN
4.0000 mg | Freq: Once | INTRAMUSCULAR | Status: AC
  Administered 2022-04-10: 4 mg via INTRAVENOUS
  Filled 2022-04-10: qty 2

## 2022-04-10 MED ORDER — SODIUM CHLORIDE 0.9 % IV BOLUS
500.0000 mL | Freq: Once | INTRAVENOUS | Status: AC
  Administered 2022-04-10: 500 mL via INTRAVENOUS

## 2022-04-10 MED ORDER — IOHEXOL 350 MG/ML SOLN
100.0000 mL | Freq: Once | INTRAVENOUS | Status: AC | PRN
  Administered 2022-04-10: 100 mL via INTRAVENOUS

## 2022-04-10 MED ORDER — FAMOTIDINE IN NACL 20-0.9 MG/50ML-% IV SOLN
20.0000 mg | Freq: Once | INTRAVENOUS | Status: AC
  Administered 2022-04-10: 20 mg via INTRAVENOUS
  Filled 2022-04-10: qty 50

## 2022-04-10 NOTE — ED Provider Notes (Addendum)
[facility] Provider Note    Event Date/Time   First MD Initiated Contact with Patient 04/10/22 [phone]     (approximate)   History   Chest Pain   HPI  Sheena Cordova is a 21 y.o. female who presents to the ED from home with a chief complaint of chest pain.  Patient was awakened from sleep around 3 AM with central chest pain radiating to her left shoulder, worsened with deep inspiration.  Denies associated diaphoresis, palpitations, nausea/vomiting or dizziness.  Patient had recent lap chole at [facility] on 03/04/2022 for choledolithiasis.  Had removal of biliary stent on 03/29/2022.  States she has been doing well since that time.  Denies fever, chills, cough, dysuria or diarrhea.  Ate spaghetti dinner last night.     Past Medical History   Past Medical History:  Diagnosis Date   Asthma      Active Problem List   Patient Active Problem List   Diagnosis Date Noted   Indication for care in labor and delivery, antepartum 05/30/2021   Abdomen soft and nondistended 02/20/2020     Past Surgical History  No past surgical history on file.   Home Medications   Prior to Admission medications   Medication Sig Start Date End Date Taking? Authorizing Provider  famotidine (PEPCID) 20 MG tablet Take 1 tablet (20 mg total) by mouth 2 (two) times daily. 04/10/22  Yes Paulette Blanch, MD  albuterol (PROVENTIL HFA;VENTOLIN HFA) 108 (90 BASE) MCG/ACT inhaler Inhale 2 puffs into the lungs every 6 (six) hours as needed for wheezing or shortness of breath.    [provider]  amoxicillin-clavulanate (AUGMENTIN) 875-125 MG per tablet Take 1 tablet by mouth 2 (two) times daily. 05/02/15   Triplett, Johnette Abraham B, FNP  aspirin 325 MG EC tablet Take 325 mg by mouth daily.    [provider]  doxycycline (VIBRAMYCIN) 100 MG capsule Take 1 capsule (100 mg total) by mouth 2 (two) times daily. One po bid x 7 days 09/25/15   Mesner, Corene Cornea, MD  HYDROcodone-acetaminophen (NORCO)  5-325 MG tablet Take 1 tablet by mouth every 12 (twelve) hours as needed for severe pain. 09/25/15   Mesner, Corene Cornea, MD  ibuprofen (ADVIL,MOTRIN) 400 MG tablet Take 1 tablet (400 mg total) by mouth every 6 (six) hours as needed. 05/05/15   Sable Feil, PA-C  ibuprofen (ADVIL,MOTRIN) 600 MG tablet Take 1 tablet (600 mg total) by mouth once. 05/05/15   Sable Feil, PA-C  ibuprofen (ADVIL,MOTRIN) 800 MG tablet Take 1 tablet (800 mg total) by mouth every 8 (eight) hours as needed. 05/02/15   Triplett, Dessa Phi, FNP  phenazopyridine (PYRIDIUM) 95 MG tablet Take 1 tablet (95 mg total) by mouth 3 (three) times daily as needed for pain. 09/25/15   Mesner, Corene Cornea, MD  Prenatal Vit-Fe Fumarate-FA (PRENATAL MULTIVITAMIN) TABS tablet Take 1 tablet by mouth daily at 12 noon. 10/20/19   Carrie Mew, MD  traMADol (ULTRAM) 50 MG tablet Take 1 tablet (50 mg total) by mouth every 6 (six) hours as needed for moderate pain. 05/05/15   Sable Feil, PA-C  traMADol (ULTRAM) 50 MG tablet Take 1 tablet (50 mg total) by mouth 4 (four) times daily. 05/05/15   Sable Feil, PA-C     Allergies  Patient has no known allergies.   Family History  No family history on file.   Physical Exam  Triage Vital Signs: ED Triage Vitals  Enc Vitals Group  BP 04/10/22 0338 (!) 180/113     Pulse Rate 04/10/22 0338 (!) 114     Resp 04/10/22 0338 20     Temp 04/10/22 0338 99 F (37.2 C)     Temp src --      SpO2 04/10/22 0338 94 %     Weight 04/10/22 0339 150 lb (68 kg)     Height 04/10/22 0339 5\' 7"  (1.702 m)     Head Circumference --      Peak Flow --      Pain Score 04/10/22 0338 10     Pain Loc --      Pain Edu? --      Excl. in [place]? --     Updated Vital Signs: BP 106/65   Pulse 81   Temp 99 F (37.2 C)   Resp 17   Ht 5\' 7"  (1.702 m)   Wt 68 kg   LMP  (LMP Unknown)   SpO2 100%   BMI 23.49 kg/m    General: Awake, mild distress.  CV:  Tachycardic.  Good peripheral perfusion.  Resp:  Normal  effort.  CTA B.  Mid chest tender to palpation. Abd:  Mild epigastric tenderness to palpation without rebound or guarding.  No distention.  Other:  No vesicles.  No calf swelling or tenderness.   ED Results / Procedures / Treatments  Labs (all labs ordered are listed, but only abnormal results are displayed) Labs Reviewed  CBC WITH DIFFERENTIAL/PLATELET - Abnormal; Notable for the following components:      Result Value   WBC 14.8 (*)    RBC 3.60 (*)    Hemoglobin 8.0 (*)    HCT 27.4 (*)    MCV 76.1 (*)    MCH 22.2 (*)    MCHC 29.2 (*)    RDW 15.9 (*)    Neutro Abs 10.3 (*)    Monocytes Absolute 1.1 (*)    All other components within normal limits  COMPREHENSIVE METABOLIC PANEL - Abnormal; Notable for the following components:   Potassium 3.4 (*)    Glucose, Bld 128 (*)    Total Bilirubin 1.6 (*)    All other components within normal limits  D-DIMER, QUANTITATIVE  LIPASE, BLOOD  POC URINE PREG, ED  TROPONIN I (HIGH SENSITIVITY)  TROPONIN I (HIGH SENSITIVITY)     EKG  ED ECG REPORT I, Braxson Hollingsworth J, the attending physician, personally viewed and interpreted this ECG.   Date: 04/10/2022  EKG Time: 0347  Rate: 90  Rhythm: normal sinus rhythm  Axis: Normal  Intervals:none  ST&T Change: Nonspecific    RADIOLOGY I have independently visualized and interpreted patient's chest x-ray, CTA chest, abdomen/pelvis as well as noted the radiology interpretation:  Chest x-ray: No acute cardiopulmonary process  CTA chest: No PE  CT abdomen pelvis: Recent post-op changes, prominent pelvic veins  Official radiology report(s): CT Angio Chest PE W/Cm &/Or Wo Cm  Result Date: 04/10/2022 CLINICAL DATA:  21 year old female with chest pain, pleuritic pain. Diaphoresis. "Recent surgery". EXAM: CT ANGIOGRAPHY CHEST CT ABDOMEN AND PELVIS WITH CONTRAST TECHNIQUE: Multidetector CT imaging of the chest was performed using the standard protocol during bolus administration of intravenous  contrast. Multiplanar CT image reconstructions and MIPs were obtained to evaluate the vascular anatomy. Multidetector CT imaging of the abdomen and pelvis was performed using the standard protocol during bolus administration of intravenous contrast. RADIATION DOSE REDUCTION: This exam was performed according to the departmental dose-optimization program which includes automated exposure control,  adjustment of the mA and/or kV according to patient size and/or use of iterative reconstruction technique. CONTRAST:  192mL OMNIPAQUE IOHEXOL 350 MG/ML SOLN COMPARISON:  Portable chest 0420 hours today. FINDINGS: CTA CHEST FINDINGS Cardiovascular: Excellent contrast bolus timing in the pulmonary arterial tree. Mild cardiac pulsation. No focal filling defect identified in the pulmonary arteries to suggest acute pulmonary embolism. Cardiac size within normal limits. No pericardial effusion. Negative thoracic aorta. Unremarkable proximal great vessels. No coronary artery calcified plaque is evident. Mediastinum/Nodes: Negative. No mediastinal mass or lymphadenopathy. Small volume thymus. Lungs/Pleura: Major airways are patent. Somewhat low lung volumes. No pleural effusion or consolidation. Small subpleural 3-4 mm lingula lung nodule on series 5, image 71, most likely postinflammatory in this age group (no follow-up imaging recommended). Musculoskeletal: Minor scoliosis. No acute osseous abnormality identified. Review of the MIP images confirms the above findings. CT ABDOMEN and PELVIS FINDINGS Hepatobiliary: Gallbladder appears to be surgically absent with trace mildly complex fluid in the gallbladder fossa (coronal image 31). There is mild intra-and extrahepatic biliary ductal dilatation, but the CBD tapers. No filling defect is evident by CT. Hepatic periportal edema is a differential consideration. Liver enhancement otherwise within normal limits. No perihepatic fluid. Pancreas: Negative. Spleen: Negative. Adrenals/Urinary  Tract: Normal adrenal glands. Symmetric renal enhancement and kidneys appear nonobstructed. No nephrolithiasis or pararenal inflammation identified. However, there are small venous collaterals at the left renal hilum, in association with prominent left gonadal vein venous enhancement. Diminutive and unremarkable bladder. Stomach/Bowel: Mild to moderate retained stool throughout the large bowel. No convincing large bowel inflammation. Decompressed terminal ileum. No oral contrast administered, but there is evidence of a normal gas containing appendix in the right hemipelvis coronal image 41. No dilated small bowel. Stomach is mildly fluid distended. Duodenum is decompressed. There are mild postoperative changes to the ventral abdominal wall, including a possible recent port site on series 2, image 23 overlying the liver. No free air. No free fluid in the abdomen. Vascular/Lymphatic: Major arterial structures in the abdomen and pelvis appear patent and normal. Portal venous system is patent. No lymphadenopathy identified. Reproductive: Prominent bilateral gonadal vein and parametrial venous enhancement, but otherwise within normal limits. Other: Trace simple appearing free fluid in the cul-de-sac is likely physiologic on series 2, image 73. Musculoskeletal: Minor scoliosis.  Otherwise negative. Review of the MIP images confirms the above findings. IMPRESSION: 1. Negative for acute pulmonary embolism. 2. Probable recent Cholecystectomy. Expected postoperative appearance Except for a degree of generalized intrahepatic biliary ductal dilatation versus periportal edema. The CBD appears to taper normally. No filling defect identified by CT. Query hyperbilirubinemia. 3. Prominent gonadal and parametrial venous enhancement and, associated with some venous collaterals in the left renal hilum. Constellation raises the possibility of Chronic Left Renal Vein Compression by the SMA ("nutcracker syndrome" versus pelvic venous  congestion syndrome (primary gonadal vein insufficiency". 4. No other acute or inflammatory process identified in the abdomen. Trace simple appearing free fluid in the cul-de-sac appears physiologic. Electronically Signed   By: Genevie Ann M.D.   On: 04/10/2022 05:43   CT Abdomen Pelvis W Contrast  Result Date: 04/10/2022 CLINICAL DATA:  21 year old female with chest pain, pleuritic pain. Diaphoresis. "Recent surgery". EXAM: CT ANGIOGRAPHY CHEST CT ABDOMEN AND PELVIS WITH CONTRAST TECHNIQUE: Multidetector CT imaging of the chest was performed using the standard protocol during bolus administration of intravenous contrast. Multiplanar CT image reconstructions and MIPs were obtained to evaluate the vascular anatomy. Multidetector CT imaging of the abdomen and pelvis was performed  using the standard protocol during bolus administration of intravenous contrast. RADIATION DOSE REDUCTION: This exam was performed according to the departmental dose-optimization program which includes automated exposure control, adjustment of the mA and/or kV according to patient size and/or use of iterative reconstruction technique. CONTRAST:  155mL OMNIPAQUE IOHEXOL 350 MG/ML SOLN COMPARISON:  Portable chest 0420 hours today. FINDINGS: CTA CHEST FINDINGS Cardiovascular: Excellent contrast bolus timing in the pulmonary arterial tree. Mild cardiac pulsation. No focal filling defect identified in the pulmonary arteries to suggest acute pulmonary embolism. Cardiac size within normal limits. No pericardial effusion. Negative thoracic aorta. Unremarkable proximal great vessels. No coronary artery calcified plaque is evident. Mediastinum/Nodes: Negative. No mediastinal mass or lymphadenopathy. Small volume thymus. Lungs/Pleura: Major airways are patent. Somewhat low lung volumes. No pleural effusion or consolidation. Small subpleural 3-4 mm lingula lung nodule on series 5, image 71, most likely postinflammatory in this age group (no follow-up  imaging recommended). Musculoskeletal: Minor scoliosis. No acute osseous abnormality identified. Review of the MIP images confirms the above findings. CT ABDOMEN and PELVIS FINDINGS Hepatobiliary: Gallbladder appears to be surgically absent with trace mildly complex fluid in the gallbladder fossa (coronal image 31). There is mild intra-and extrahepatic biliary ductal dilatation, but the CBD tapers. No filling defect is evident by CT. Hepatic periportal edema is a differential consideration. Liver enhancement otherwise within normal limits. No perihepatic fluid. Pancreas: Negative. Spleen: Negative. Adrenals/Urinary Tract: Normal adrenal glands. Symmetric renal enhancement and kidneys appear nonobstructed. No nephrolithiasis or pararenal inflammation identified. However, there are small venous collaterals at the left renal hilum, in association with prominent left gonadal vein venous enhancement. Diminutive and unremarkable bladder. Stomach/Bowel: Mild to moderate retained stool throughout the large bowel. No convincing large bowel inflammation. Decompressed terminal ileum. No oral contrast administered, but there is evidence of a normal gas containing appendix in the right hemipelvis coronal image 41. No dilated small bowel. Stomach is mildly fluid distended. Duodenum is decompressed. There are mild postoperative changes to the ventral abdominal wall, including a possible recent port site on series 2, image 23 overlying the liver. No free air. No free fluid in the abdomen. Vascular/Lymphatic: Major arterial structures in the abdomen and pelvis appear patent and normal. Portal venous system is patent. No lymphadenopathy identified. Reproductive: Prominent bilateral gonadal vein and parametrial venous enhancement, but otherwise within normal limits. Other: Trace simple appearing free fluid in the cul-de-sac is likely physiologic on series 2, image 73. Musculoskeletal: Minor scoliosis.  Otherwise negative. Review of  the MIP images confirms the above findings. IMPRESSION: 1. Negative for acute pulmonary embolism. 2. Probable recent Cholecystectomy. Expected postoperative appearance Except for a degree of generalized intrahepatic biliary ductal dilatation versus periportal edema. The CBD appears to taper normally. No filling defect identified by CT. Query hyperbilirubinemia. 3. Prominent gonadal and parametrial venous enhancement and, associated with some venous collaterals in the left renal hilum. Constellation raises the possibility of Chronic Left Renal Vein Compression by the SMA ("nutcracker syndrome" versus pelvic venous congestion syndrome (primary gonadal vein insufficiency". 4. No other acute or inflammatory process identified in the abdomen. Trace simple appearing free fluid in the cul-de-sac appears physiologic. Electronically Signed   By: Genevie Ann M.D.   On: 04/10/2022 05:43   DG Chest Port 1 View  Result Date: 04/10/2022 CLINICAL DATA:  21 year old female with chest pain, pleuritic pain. Diaphoresis. "Recent surgery". EXAM: PORTABLE CHEST 1 VIEW COMPARISON:  None Available. FINDINGS: Portable AP upright view at 0420 hours. Lung volumes and mediastinal contours are within  normal limits. Visualized tracheal air column is within normal limits. Allowing for portable technique the lungs are clear. No pneumothorax or pleural effusion. Paucity of bowel gas in the upper abdomen. No osseous abnormality identified. IMPRESSION: Negative portable chest. Electronically Signed   By: Genevie Ann M.D.   On: 04/10/2022 04:49     PROCEDURES:  Critical Care performed: No  .1-3 Lead EKG Interpretation Performed by: Paulette Blanch, MD Authorized by: Paulette Blanch, MD     Interpretation: abnormal     ECG rate:  114   ECG rate assessment: tachycardic     Rhythm: sinus tachycardia     Ectopy: none     Conduction: normal   Comments:     Patient on cardiac monitor to evaluate for arrhythmias   MEDICATIONS ORDERED IN  ED: Medications  ondansetron (ZOFRAN) injection 4 mg (4 mg Intravenous Given 04/10/22 0413)  sodium chloride 0.9 % bolus 500 mL (500 mLs Intravenous Bolus 04/10/22 0417)  fentaNYL (SUBLIMAZE) injection 50 mcg (50 mcg Intravenous Given 04/10/22 0414)  famotidine (PEPCID) IVPB 20 mg premix (0 mg Intravenous Stopped 04/10/22 0519)  iohexol (OMNIPAQUE) 350 MG/ML injection 100 mL (100 mLs Intravenous Contrast Given 04/10/22 0456)     IMPRESSION / MDM / ASSESSMENT AND PLAN / ED COURSE  I reviewed the triage vital signs and the nursing notes.                             21 year old female presenting with chest/epigastric pain.  I have identified this patient to have a potentially life-threatening condition. Differential diagnosis includes, but is not limited to, ACS, aortic dissection, pulmonary embolism, cardiac tamponade, pneumothorax, pneumonia, pericarditis, myocarditis, GI-related causes including esophagitis/gastritis, and musculoskeletal chest wall pain.     I have personally reviewed patient's records and note her [facility] hospitalization and surgery op note 03/05/2022 as well as ERCP note from 03/29/2022.  The patient is on the cardiac monitor to evaluate for evidence of arrhythmia and/or significant heart rate changes.  We will obtain cardiac panel, LFTs/lipase, D-dimer.  Start with chest x-ray imaging.  Consider CTA chest to evaluate for PE, CT abdomen/pelvis given patient's recent surgery and biliary stent removal.  Will reassess.  Clinical Course as of 04/10/22 0625  Mon Apr 10, 2022  0438 Laboratory results demonstrate moderate leukocytosis WBC 14.8, T. bili 1.6 slightly up from labs done 03/05/2022 at [facility].  H/H slightly lower than prior 9/29 dated 03/05/2022.  Other D-dimer is negative, will obtain CTA chest to evaluate for PE as well as CT abdomen/pelvis given patient's recent biliary stent removal. [JS]  0559 Patient feeling significantly better.  Repeat troponin is pending.  Updated patient of CT  results demonstrating no PE, postoperative changes.  Specifically queried whether patient has been experiencing left flank or pelvic pain which she has not. [JS]  [id number] Repeat troponin remains normal.  Will discharge home with prescription for twice daily Pepcid and patient will follow-up with her surgeon closely.  Strict return precautions given.  Patient verbalizes understanding agrees with plan of care. [JS]    Clinical Course User Index [JS] Paulette Blanch, MD     FINAL CLINICAL IMPRESSION(S) / ED DIAGNOSES   Final diagnoses:  Nonspecific chest pain     Rx / DC Orders   ED Discharge Orders          Ordered    famotidine (PEPCID) 20 MG tablet  2 times daily  04/10/22 0603             Note:  This document was prepared using Dragon voice recognition software and may include unintentional dictation errors.   Paulette Blanch, MD 04/10/22 [id number]    Paulette Blanch, MD 04/10/22 [phone]

## 2022-04-10 NOTE — ED Notes (Signed)
Pt stated her mother would be picking her up from the hospital

## 2022-04-10 NOTE — ED Triage Notes (Signed)
Pt with onset of mid chest pain this am. Pt states pain is worse with deep breath. Pt recently had surgery. Pt denies hemoptysis, pt states she feels shob, pt is slightly diaphoretic. Pt appears very uncomfortable.

## 2022-04-10 NOTE — Discharge Instructions (Signed)
Take Pepcid 20 mg twice daily.  Eat bland foods x1 week, then slowly advance diet as tolerated.  Return to the ER for worsening symptoms, persistent vomiting, difficulty breathing or other concerns.

## 2022-04-10 NOTE — ED Notes (Signed)
While obtaining vital signs in triage, pt suddenly stated "I have to get up, oh my god, the pain, I don't think I can breathe". Pt became pale, diaphoretic, hr increased on vital sign monitor. Incident lasted approx 2 minutes.

## 2022-07-13 IMAGING — CT CT ABD-PELV W/ CM
2 of 4 series · 13 of 46 positions shown, 15 images · IV contrast (APPLIED)
Comparison: Portable chest 2692 hours today.

CLINICAL DATA: 21-year-old female with chest pain, pleuritic pain.
Diaphoresis. "Recent surgery".



[Series 2: abdomen 5.0 · axial · 0.72mm/px · z∈[-915,-500]mm · 10 of 93 slices shown, 12 images]
[im 5/93  soft-tissue]
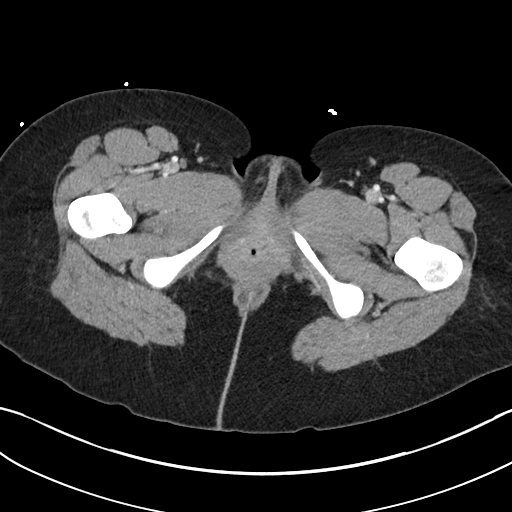
[im 5/93  bone]
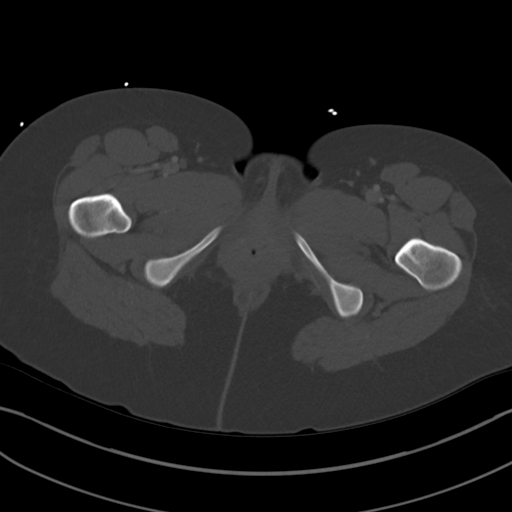
[im 15/93  soft-tissue]
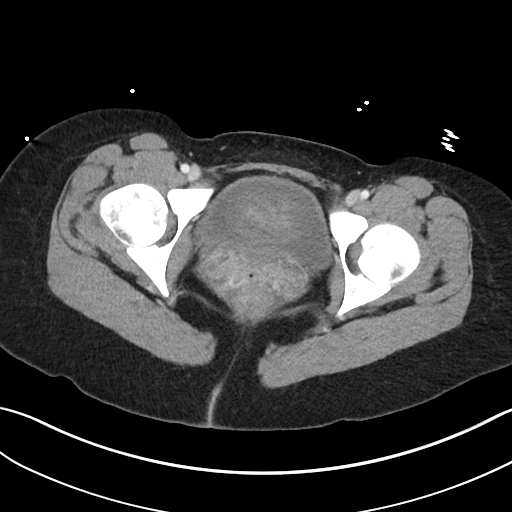
[im 25/93  soft-tissue]
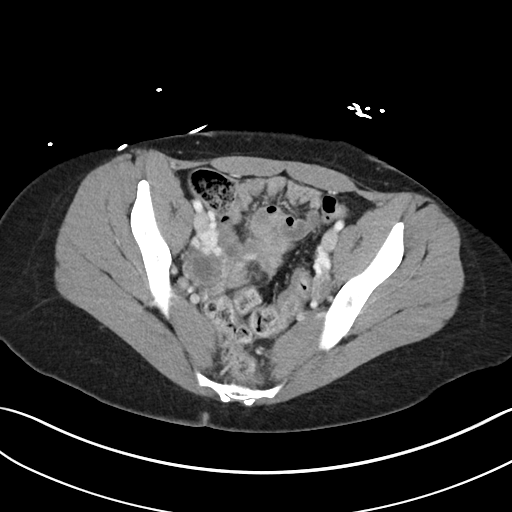
[im 34/93  soft-tissue]
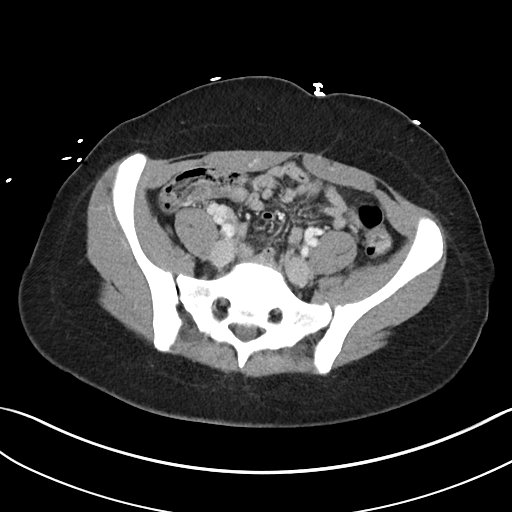
[im 44/93  soft-tissue]
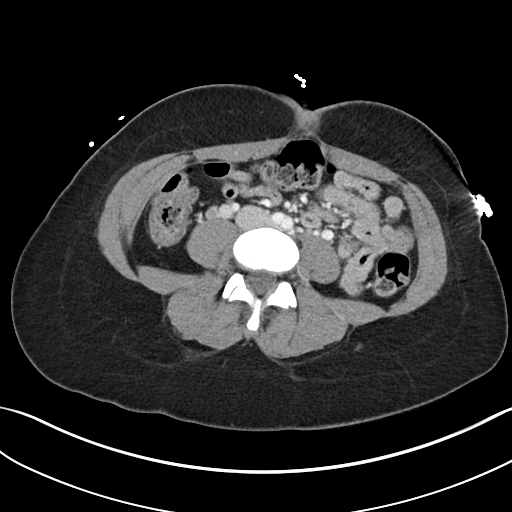
[im 49/93  soft-tissue]
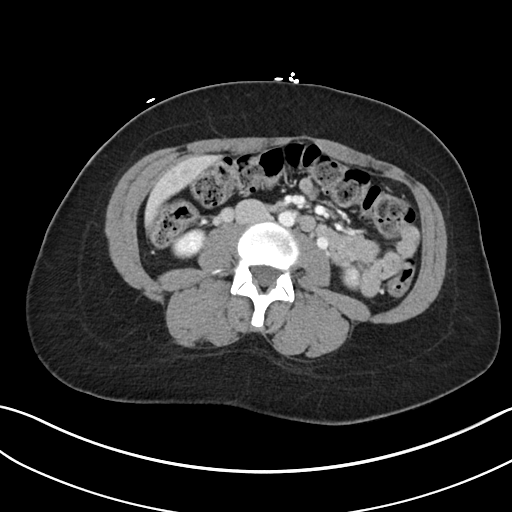
[im 59/93  soft-tissue]
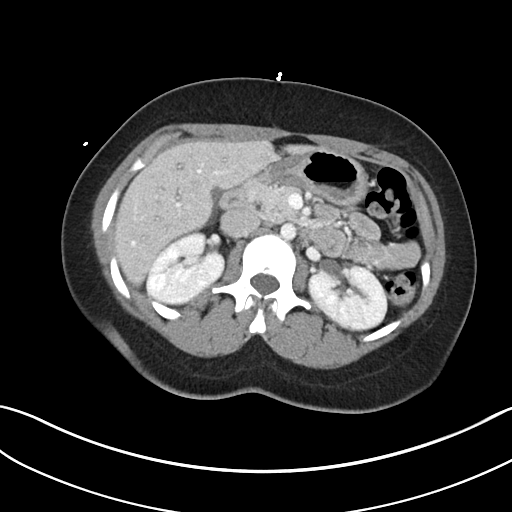
[im 68/93  soft-tissue]
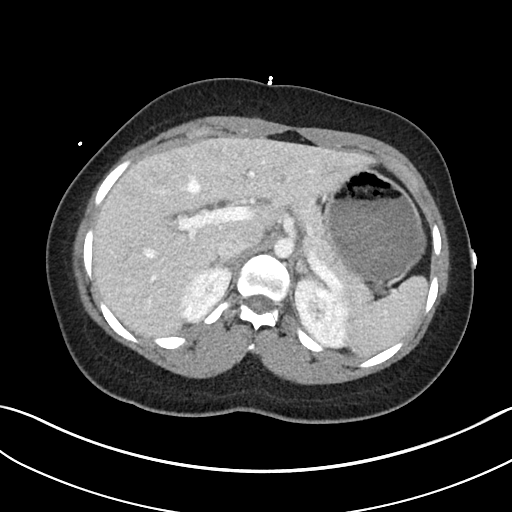
[im 78/93  soft-tissue]
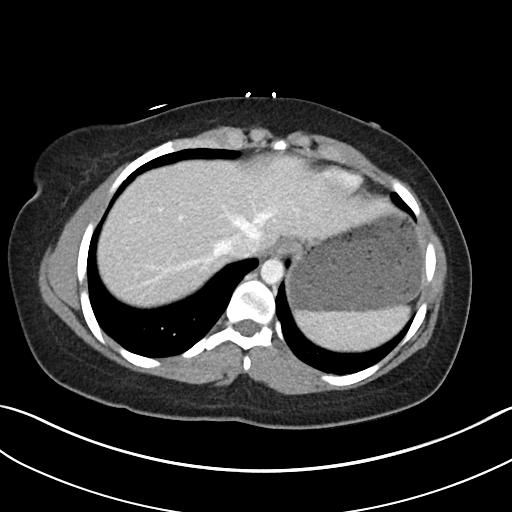
[im 78/93  bone]
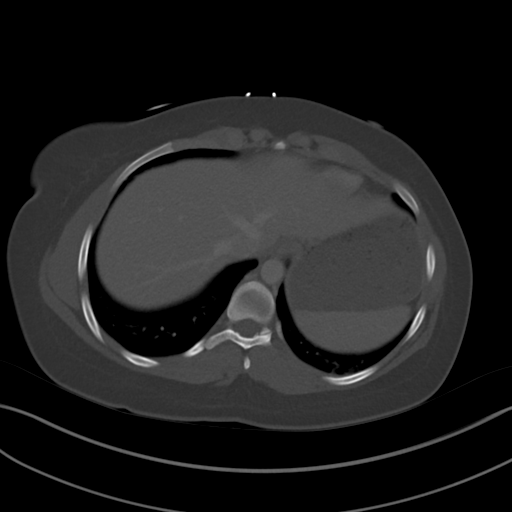
[im 88/93  soft-tissue]
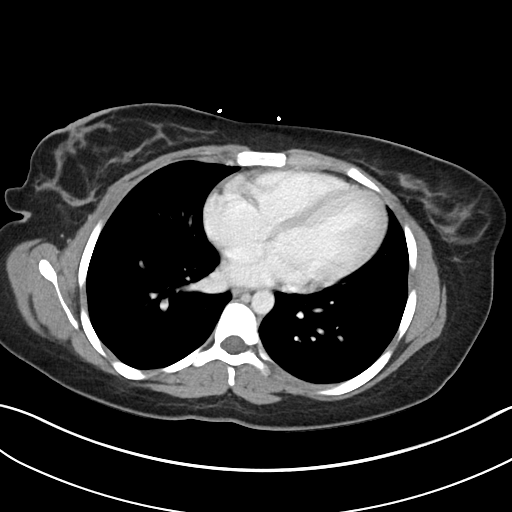

[Series 5: abdomen 3.0 mpr cor · coronal · 0.64mm/px · 3 of 75 slices shown]
[im 25/75  soft-tissue]
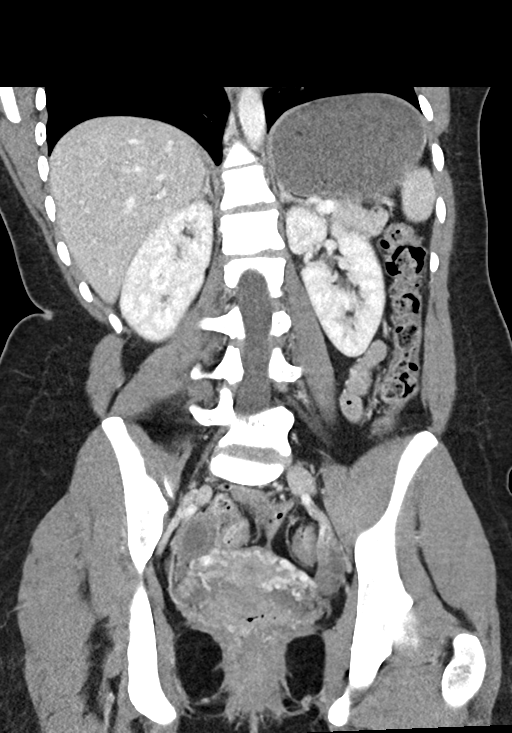
[im 33/75  soft-tissue]
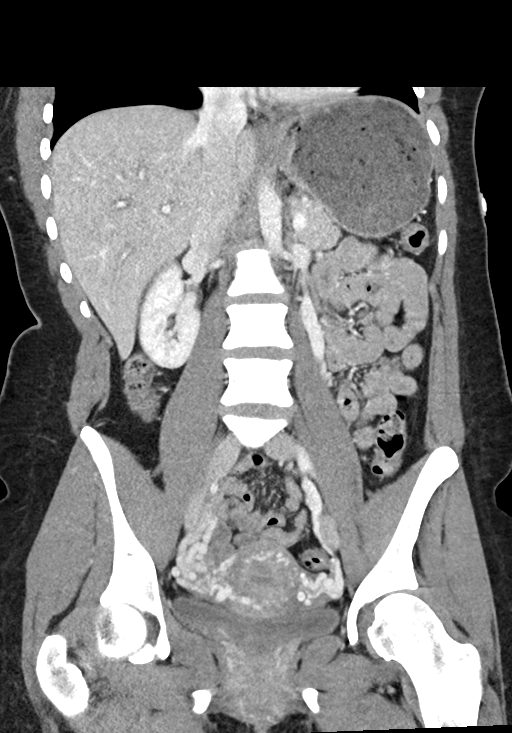
[im 42/75  soft-tissue]
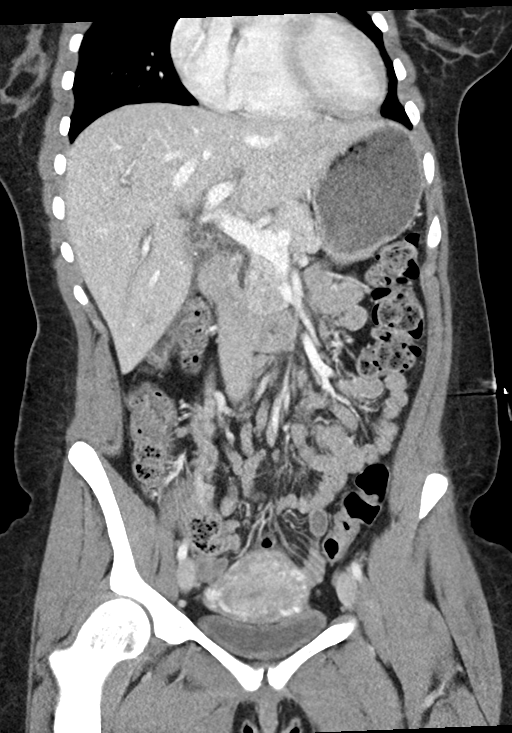

[13 of 46 positions shown; findings below may reference images not displayed]

RADIATION DOSE REDUCTION: This exam was performed according to the
departmental dose-optimization program which includes automated
exposure control, adjustment of the mA and/or kV according to
patient size and/or use of iterative reconstruction technique.

CONTRAST:  100mL OMNIPAQUE IOHEXOL 350 MG/ML SOLN
FINDINGS: CTA CHEST FINDINGS

Cardiovascular: Excellent contrast bolus timing in the pulmonary
arterial tree. Mild cardiac pulsation.

No focal filling defect identified in the pulmonary arteries to
suggest acute pulmonary embolism.

Cardiac size within normal limits. No pericardial effusion. Negative
thoracic aorta. Unremarkable proximal great vessels. No coronary
artery calcified plaque is evident.

Mediastinum/Nodes: Negative. No mediastinal mass or lymphadenopathy.
Small volume thymus.

Lungs/Pleura: Major airways are patent. Somewhat low lung volumes.
No pleural effusion or consolidation. Small subpleural 3-4 mm
lingula lung nodule on series 5, image 71, most likely
postinflammatory in this age group (no follow-up imaging
recommended).

Musculoskeletal: Minor scoliosis. No acute osseous abnormality
identified.

Review of the MIP images confirms the above findings.

CT ABDOMEN and PELVIS FINDINGS

Hepatobiliary: Gallbladder appears to be surgically absent with
trace mildly complex fluid in the gallbladder fossa (coronal image
31). There is mild intra-and extrahepatic biliary ductal dilatation,
but the CBD tapers. No filling defect is evident by CT. Hepatic
periportal edema is a differential consideration. Liver enhancement
otherwise within normal limits. No perihepatic fluid.

Pancreas: Negative.

Spleen: Negative.

Adrenals/Urinary Tract: Normal adrenal glands. Symmetric renal
enhancement and kidneys appear nonobstructed. No nephrolithiasis or
pararenal inflammation identified. However, there are small venous
collaterals at the left renal hilum, in association with prominent
left gonadal vein venous enhancement. Diminutive and unremarkable
bladder.

Stomach/Bowel: Mild to moderate retained stool throughout the large
bowel. No convincing large bowel inflammation. Decompressed terminal
ileum. No oral contrast administered, but there is evidence of a
normal gas containing appendix in the right hemipelvis coronal image
41. No dilated small bowel. Stomach is mildly fluid distended.
Duodenum is decompressed.

There are mild postoperative changes to the ventral abdominal wall,
including a possible recent port site on series 2, image 23
overlying the liver. No free air. No free fluid in the abdomen.

Vascular/Lymphatic: Major arterial structures in the abdomen and
pelvis appear patent and normal. Portal venous system is patent. No
lymphadenopathy identified.

Reproductive: Prominent bilateral gonadal vein and parametrial
venous enhancement, but otherwise within normal limits.

Other: Trace simple appearing free fluid in the cul-de-sac is likely
physiologic on series 2, image 73.

Musculoskeletal: Minor scoliosis.  Otherwise negative.

Review of the MIP images confirms the above findings.
IMPRESSION: 1. Negative for acute pulmonary embolism.

2. Probable recent Cholecystectomy. Expected postoperative
appearance Except for a degree of generalized intrahepatic biliary
ductal dilatation versus periportal edema. The CBD appears to taper
normally. No filling defect identified by CT. Query
hyperbilirubinemia.

3. Prominent gonadal and parametrial venous enhancement and,
associated with some venous collaterals in the left renal hilum.
Constellation raises the possibility of Chronic Left Renal Vein
Compression by the SMA ("nutcracker syndrome" versus pelvic venous
congestion syndrome (primary gonadal vein insufficiency".

4. No other acute or inflammatory process identified in the abdomen.
Trace simple appearing free fluid in the cul-de-sac appears
physiologic.

## 2024-05-16 NOTE — Progress Notes (Signed)
 I was the supervising physician in clinic.  I reviewed with the resident the patient's pertinent medical history and physical examination findings.  I discussed and agree with the resident's assessment and plan as documented in the note.  Patriciaann Clan, DO

## 2024-08-18 ENCOUNTER — Emergency Department
Admission: EM | Admit: 2024-08-18 | Discharge: 2024-08-18 | Disposition: A | Discharge status: Home / Self Care | Attending: Emergency Medicine | Admitting: Emergency Medicine

## 2024-08-18 ENCOUNTER — Other Ambulatory Visit: Payer: Self-pay

## 2024-08-18 DIAGNOSIS — S0992XA Unspecified injury of nose, initial encounter: Secondary | ICD-10-CM | POA: Diagnosis present

## 2024-08-18 DIAGNOSIS — S0031XA Abrasion of nose, initial encounter: Secondary | ICD-10-CM | POA: Insufficient documentation

## 2024-08-18 DIAGNOSIS — Y92410 Unspecified street and highway as the place of occurrence of the external cause: Secondary | ICD-10-CM | POA: Diagnosis not present

## 2024-08-18 MED ORDER — IBUPROFEN 600 MG PO TABS: 600.0000 mg | ORAL_TABLET | Freq: Three times a day (TID) | ORAL | 0 refills | Status: AC | PRN
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
## 2024-11-06 ENCOUNTER — Encounter: Payer: Self-pay | Admitting: Emergency Medicine

## 2024-11-06 ENCOUNTER — Emergency Department

## 2024-11-06 ENCOUNTER — Other Ambulatory Visit: Payer: Self-pay

## 2024-11-06 ENCOUNTER — Emergency Department
Admission: EM | Admit: 2024-11-06 | Discharge: 2024-11-06 | Disposition: A | Discharge status: Home / Self Care | Attending: Emergency Medicine | Admitting: Emergency Medicine

## 2024-11-06 DIAGNOSIS — R0789 Other chest pain: Secondary | ICD-10-CM | POA: Insufficient documentation

## 2024-11-06 DIAGNOSIS — D72829 Elevated white blood cell count, unspecified: Secondary | ICD-10-CM | POA: Insufficient documentation

## 2024-11-06 DIAGNOSIS — R1013 Epigastric pain: Secondary | ICD-10-CM | POA: Insufficient documentation

## 2024-11-06 HISTORY — DX: Type 2 diabetes mellitus without complications: E11.9

## 2024-11-06 LAB — BASIC METABOLIC PANEL WITH GFR
Anion gap: 12 (ref 5–15)
BUN: 11 mg/dL (ref 6–20)
CO2: 26 mmol/L (ref 22–32)
Calcium: 9.5 mg/dL (ref 8.9–10.3)
Chloride: 102 mmol/L (ref 98–111)
Creatinine, Ser: 0.76 mg/dL (ref 0.44–1.00)
GFR, Estimated: >60 mL/min
Glucose, Bld: 116 mg/dL — ABNORMAL HIGH (ref 70–99)
Potassium: 4 mmol/L (ref 3.5–5.1)
Sodium: 140 mmol/L (ref 135–145)

## 2024-11-06 LAB — HEPATIC FUNCTION PANEL
ALT: 21 U/L (ref 0–44)
AST: 49 U/L — ABNORMAL HIGH (ref 15–41)
Albumin: 4.4 g/dL (ref 3.5–5.0)
Alkaline Phosphatase: 120 U/L (ref 38–126)
Bilirubin, Direct: 0.4 mg/dL — ABNORMAL HIGH (ref 0.0–0.2)
Indirect Bilirubin: 0.4 mg/dL (ref 0.3–0.9)
Total Bilirubin: 0.8 mg/dL (ref 0.0–1.2)
Total Protein: 8.3 g/dL — ABNORMAL HIGH (ref 6.5–8.1)

## 2024-11-06 LAB — CBC
HCT: 36 % (ref 36.0–46.0)
Hemoglobin: 11.1 g/dL — ABNORMAL LOW (ref 12.0–15.0)
MCH: 23.6 pg — ABNORMAL LOW (ref 26.0–34.0)
MCHC: 30.8 g/dL (ref 30.0–36.0)
MCV: 76.4 fL — ABNORMAL LOW (ref 80.0–100.0)
Platelets: 286 K/uL (ref 150–400)
RBC: 4.71 MIL/uL (ref 3.87–5.11)
RDW: 16.4 % — ABNORMAL HIGH (ref 11.5–15.5)
WBC: 15.1 K/uL — ABNORMAL HIGH (ref 4.0–10.5)
nRBC: 0 % (ref 0.0–0.2)

## 2024-11-06 LAB — TROPONIN T, HIGH SENSITIVITY: Troponin T High Sensitivity: <15 ng/L (ref 0–19)

## 2024-11-06 LAB — LIPASE, BLOOD: Lipase: 29 U/L (ref 11–51)
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
## 2024-11-06 NOTE — ED Provider Notes (Signed)
 "  [facility] Provider Note    Event Date/Time   First MD Initiated Contact with Patient 11/06/24 1556     (approximate)   History   Chest Pain   HPI  Sheena Cordova is a 24 y.o. female with a history of cholecystitis status post cholecystectomy who presents with upper abdominal chest pain, acute onset around 2 PM, now resolved in the last 20 to 30 minutes.  She reports it was across her upper abdomen.  She denies associated nausea or vomiting.  She denies any diarrhea.  She has no cough or shortness of breath.  She denies any fever or chills.  The patient states that she did have some chest discomfort yesterday but this is resolved.  She states that the pain felt similar to the gallstone attack she was having before she had her gallbladder removed.  I reviewed the past medical records.  The patient had a cholecystectomy at [facility] in April 2023.   Physical Exam   Triage Vital Signs: ED Triage Vitals  Encounter Vitals Group     BP 11/06/24 1500 122/79     Girls Systolic BP Percentile --      Girls Diastolic BP Percentile --      Boys Systolic BP Percentile --      Boys Diastolic BP Percentile --      Pulse Rate 11/06/24 1500 86     Resp 11/06/24 1500 18     Temp 11/06/24 1500 98 F (36.7 C)     Temp Source 11/06/24 1500 Oral     SpO2 11/06/24 1500 98 %     Weight 11/06/24 1459 229 lb 4.5 oz (104 kg)     Height 11/06/24 1459 5' 7 (1.702 m)     Head Circumference --      Peak Flow --      Pain Score 11/06/24 1459 8     Pain Loc --      Pain Education --      Exclude from Growth Chart --     Most recent vital signs: Vitals:   11/06/24 1500  BP: 122/79  Pulse: 86  Resp: 18  Temp: 98 F (36.7 C)  SpO2: 98%     General: Alert, comfortable appearing, no distress.  CV:  Good peripheral perfusion.  Resp:  Normal effort.  Lungs CTAB. Abd:  Soft and nontender.  No distention.  Other:  No jaundice or scleral icterus.   ED Results / Procedures /  Treatments   Labs (all labs ordered are listed, but only abnormal results are displayed) Labs Reviewed  BASIC METABOLIC PANEL WITH GFR - Abnormal; Notable for the following components:      Result Value   Glucose, Bld 116 (*)    All other components within normal limits  CBC - Abnormal; Notable for the following components:   WBC 15.1 (*)    Hemoglobin 11.1 (*)    MCV 76.4 (*)    MCH 23.6 (*)    RDW 16.4 (*)    All other components within normal limits  HEPATIC FUNCTION PANEL - Abnormal; Notable for the following components:   Total Protein 8.3 (*)    AST 49 (*)    Bilirubin, Direct 0.4 (*)    All other components within normal limits  LIPASE, BLOOD  POC URINE PREG, ED  TROPONIN T, HIGH SENSITIVITY     EKG  ED ECG REPORT I, Waylon Cassis, the attending physician, personally viewed and interpreted  this ECG.  Date: 11/06/2024 EKG Time: 1506 Rate: 97 Rhythm: normal sinus rhythm QRS Axis: normal Intervals: normal ST/T Wave abnormalities: normal Narrative Interpretation: no evidence of acute ischemia   RADIOLOGY  Chest x-ray: I independently viewed and interpreted the images; there is no focal consolidation or edema  PROCEDURES:  Critical Care performed: No  Procedures   MEDICATIONS ORDERED IN ED: Medications - No data to display   IMPRESSION / MDM / ASSESSMENT AND PLAN / ED COURSE  I reviewed the triage vital signs and the nursing notes.  24 year old female with PMH as noted above presents with upper abdominal pain associate with atypical chest pain that is now completely resolved.  Abdomen soft and nontender.  EKG is nonischemic.  Differential diagnosis includes, but is not limited to, GERD, gastritis, PUD, gastroenteritis, pancreatitis.  There is no evidence of retained gallstones or other hepatobiliary cause.  There is no clinical evidence of ACS.  We will obtain lab workup and reassess.  Patient's presentation is most consistent with acute  complicated illness / injury requiring diagnostic workup.  ----------------------------------------- 5:38 PM on 11/06/2024 -----------------------------------------  CBC shows leukocytosis although this appears somewhat chronic for the patient.  She has no signs or symptoms of active infection.  BMP is unremarkable.  Hepatic panel shows minimal elevation of the AST and bilirubin that do not appear to be clinically significant.  Lipase is normal.  Troponin is negative.  Given the patient is low risk for ACS and normal EKG, there is no indication for serial enzymes.  On reassessment, the patient remains pain-free.  She is stable for discharge at this time.  I counseled her on the results of the workup and answered her questions.  I gave strict return precautions, and she expressed understanding.   FINAL CLINICAL IMPRESSION(S) / ED DIAGNOSES   Final diagnoses:  Atypical chest pain  Epigastric pain     Rx / DC Orders   ED Discharge Orders     None        Note:  This document was prepared using Dragon voice recognition software and may include unintentional dictation errors.    Jacolyn Pae, MD 11/06/24 1739  "
